# Patient Record
Sex: Female | Born: 1985 | Race: White | Hispanic: No | Marital: Married | Smoking: Never smoker
Health system: Southern US, Community
[De-identification: ages and names within clinical notes are randomized; demographics above are authoritative.]

## PROBLEM LIST (undated history)

## (undated) DIAGNOSIS — Z789 Other specified health status: Secondary | ICD-10-CM

## (undated) DIAGNOSIS — Z8619 Personal history of other infectious and parasitic diseases: Secondary | ICD-10-CM

## (undated) HISTORY — PX: NO PAST SURGERIES: SHX2092

## (undated) HISTORY — DX: Personal history of other infectious and parasitic diseases: Z86.19

---

## 2008-03-03 ENCOUNTER — Emergency Department (HOSPITAL_COMMUNITY): Admission: EM | Admit: 2008-03-03 | Discharge: 2008-03-04 | Payer: Self-pay | Admitting: Emergency Medicine

## 2008-03-04 ENCOUNTER — Emergency Department (HOSPITAL_COMMUNITY): Admission: EM | Admit: 2008-03-04 | Discharge: 2008-03-04 | Payer: Self-pay | Admitting: Family Medicine

## 2008-07-13 ENCOUNTER — Emergency Department (HOSPITAL_COMMUNITY): Admission: EM | Admit: 2008-07-13 | Discharge: 2008-07-13 | Payer: Self-pay | Admitting: Emergency Medicine

## 2008-10-20 ENCOUNTER — Inpatient Hospital Stay (HOSPITAL_COMMUNITY): Admission: AD | Admit: 2008-10-20 | Discharge: 2008-10-20 | Payer: Self-pay | Admitting: Obstetrics and Gynecology

## 2008-10-20 ENCOUNTER — Ambulatory Visit: Payer: Self-pay | Admitting: Obstetrics and Gynecology

## 2008-10-23 ENCOUNTER — Inpatient Hospital Stay (HOSPITAL_COMMUNITY): Admission: AD | Admit: 2008-10-23 | Discharge: 2008-10-23 | Payer: Self-pay | Admitting: Obstetrics and Gynecology

## 2008-12-05 ENCOUNTER — Inpatient Hospital Stay (HOSPITAL_COMMUNITY): Admission: AD | Admit: 2008-12-05 | Discharge: 2008-12-05 | Payer: Self-pay | Admitting: Obstetrics and Gynecology

## 2008-12-27 ENCOUNTER — Inpatient Hospital Stay (HOSPITAL_COMMUNITY): Admission: AD | Admit: 2008-12-27 | Discharge: 2008-12-29 | Payer: Self-pay | Admitting: Obstetrics and Gynecology

## 2009-08-05 HISTORY — PX: EYE SURGERY: SHX253

## 2009-10-31 ENCOUNTER — Encounter: Admission: RE | Admit: 2009-10-31 | Discharge: 2009-11-27 | Payer: Self-pay | Admitting: Family Medicine

## 2010-04-12 ENCOUNTER — Other Ambulatory Visit: Admission: RE | Admit: 2010-04-12 | Discharge: 2010-04-12 | Payer: Self-pay | Admitting: Family Medicine

## 2010-05-24 ENCOUNTER — Encounter: Admission: RE | Admit: 2010-05-24 | Discharge: 2010-05-24 | Payer: Self-pay | Admitting: Family Medicine

## 2010-05-28 ENCOUNTER — Emergency Department (HOSPITAL_COMMUNITY): Admission: EM | Admit: 2010-05-28 | Discharge: 2010-05-28 | Payer: Self-pay | Admitting: Emergency Medicine

## 2010-10-17 LAB — URINALYSIS, ROUTINE W REFLEX MICROSCOPIC
Glucose, UA: NEGATIVE mg/dL
Protein, ur: NEGATIVE mg/dL
pH: 5.5 (ref 5.0–8.0)

## 2010-10-17 LAB — BASIC METABOLIC PANEL
BUN: 14 mg/dL (ref 6–23)
Calcium: 9.5 mg/dL (ref 8.4–10.5)
Creatinine, Ser: 0.65 mg/dL (ref 0.4–1.2)
GFR calc non Af Amer: >60 mL/min (ref >60)
Potassium: 4.1 mEq/L (ref 3.5–5.1)

## 2010-10-17 LAB — CBC
HCT: 45.6 % (ref 36.0–46.0)
Platelets: 376 10*3/uL (ref 150–400)
RDW: 12.9 % (ref 11.5–15.5)
WBC: 16.7 10*3/uL — ABNORMAL HIGH (ref 4.0–10.5)

## 2010-10-17 LAB — URINE MICROSCOPIC-ADD ON

## 2010-11-13 LAB — CBC
HCT: 34.3 % — ABNORMAL LOW (ref 36.0–46.0)
Hemoglobin: 11.9 g/dL — ABNORMAL LOW (ref 12.0–15.0)
Hemoglobin: 13.9 g/dL (ref 12.0–15.0)
RBC: 3.65 MIL/uL — ABNORMAL LOW (ref 3.87–5.11)
RBC: 4.24 MIL/uL (ref 3.87–5.11)
WBC: 12.9 10*3/uL — ABNORMAL HIGH (ref 4.0–10.5)
WBC: 13.9 10*3/uL — ABNORMAL HIGH (ref 4.0–10.5)

## 2010-11-13 LAB — URINALYSIS, ROUTINE W REFLEX MICROSCOPIC
Bilirubin Urine: NEGATIVE
Hgb urine dipstick: NEGATIVE
Specific Gravity, Urine: <1.005 — ABNORMAL LOW (ref 1.005–1.030)
pH: 6 (ref 5.0–8.0)

## 2010-11-13 LAB — WET PREP, GENITAL
Clue Cells Wet Prep HPF POC: NONE SEEN
Trich, Wet Prep: NONE SEEN
Yeast Wet Prep HPF POC: NONE SEEN

## 2010-11-13 LAB — CCBB MATERNAL DONOR DRAW

## 2010-11-13 LAB — RPR: RPR Ser Ql: NONREACTIVE

## 2010-11-15 LAB — CBC
HCT: 38 % (ref 36.0–46.0)
Hemoglobin: 13.1 g/dL (ref 12.0–15.0)
MCV: 92.6 fL (ref 78.0–100.0)
Platelets: 242 10*3/uL (ref 150–400)
WBC: 16.4 10*3/uL — ABNORMAL HIGH (ref 4.0–10.5)

## 2010-11-15 LAB — DIFFERENTIAL
Eosinophils Absolute: 0.2 10*3/uL (ref 0.0–0.7)
Eosinophils Relative: 1 % (ref 0–5)
Lymphocytes Relative: 19 % (ref 12–46)
Lymphs Abs: 3.2 10*3/uL (ref 0.7–4.0)
Monocytes Absolute: 1 10*3/uL (ref 0.1–1.0)
Monocytes Relative: 6 % (ref 3–12)

## 2010-11-15 LAB — URINALYSIS, ROUTINE W REFLEX MICROSCOPIC
Glucose, UA: NEGATIVE mg/dL
Ketones, ur: NEGATIVE mg/dL
Protein, ur: NEGATIVE mg/dL
Urobilinogen, UA: 0.2 mg/dL (ref 0.0–1.0)

## 2010-11-15 LAB — URINE MICROSCOPIC-ADD ON

## 2010-12-18 NOTE — Discharge Summary (Signed)
Jenna [name], Jenna [name]             ACCOUNT NO.:  [id number]   MEDICAL RECORD NO.:  [id number]          PATIENT TYPE:  INP   LOCATION:  9114                          FACILITY:  [facility]   PHYSICIAN:  Sherron Monday, MD        DATE OF BIRTH:  [birth year]   DATE OF ADMISSION:  12/27/2008  DATE OF DISCHARGE:  12/29/2008                               DISCHARGE SUMMARY   ADMITTING DIAGNOSIS:  Intrauterine pregnancy at term with spontaneous  rupture of membranes.   DISCHARGE DIAGNOSIS:  Intrauterine pregnancy at term with spontaneous  rupture of membranes, delivered via spontaneous vaginal delivery.   HISTORY OF PRESENT ILLNESS:  A 25 year old, G1, P0, at 33 plus weeks  with an [facility] of Dec 28, 2008, by a 8-week ultrasound, presented with  spontaneous rupture of membranes at 3:30 a.m. for clear fluid.  She  states she has had regular contractions for 24 hours.  On admission, she  was 2 cm dilated and grossly ruptured.  Prenatal care was uncomplicated  except for a positive group B strep.  She is a smoker who decreased her  smoking with her pregnancy.   Past medical history is not significant.   Past surgical history significant for wisdom teeth extraction, Lasix,  and myringotomy.   PAST OBSTETRIC/GYNECOLOGIC HISTORY:  G1 is the current pregnancy.  She  has a history of Chlamydia, however, was negative with the pregnancy.  No history of any abnormal Pap smears.   Medications include prenatal vitamins and Tylenol.   ALLERGIES:  No known drug allergies.   SOCIAL HISTORY:  Denies alcohol, tobacco, or drug use.   FAMILY HISTORY:  Per the prenatal forms.   PRENATAL LABORATORY DATA:  O positive, antibody screen negative, rubella  immune, hepatitis B surface antigen negative, HIV negative, RPR  nonreactive.  Gonorrhea and chlamydia negative.  Glucola 71.  First and  second trimester screen were declined.  Group B strep was positive.   She was admitted and had an IUPC placed.  She was complete +3  and pushed  for approximately 20 minutes and delivered a viable female infant at  10:06 a.m. with Apgars of 9 at 1 minute and 9 at 5 minutes and a weight  of 6 pounds 3 ounces.  Placenta was expressed intact, and a left labial  laceration was repaired with 3-0 Vicryl Rapide in the typical fashion.  EBL of 500 mL.  Her postpartum course was relatively uncomplicated.  She  remained afebrile with vital signs stable throughout.  Hemoglobin  decreased from 13.9 to 11.9 on day of discharge.  She states she had no  complaints.  She had normal lochia.  Pain was well controlled.  She is  ambulating without difficulty or problems.  She was discharged home with  routine discharge instructions including numbers to call with any  questions or problems as well as prescriptions for Motrin, Vicodin,  prenatal vitamins.   DISCHARGE INFORMATION:  She is O positive, rubella immune.  She plans to  bottle feed, will use oral contraceptive pills, and she will begin a 6-  week checkup.  Her  hemoglobin decreased from 13.9 to 11.9.      Sherron Monday, MD  Electronically Signed     JB/MEDQ  D:  12/29/2008  T:  12/29/2008  Job:  [id number]

## 2011-02-07 IMAGING — CT CT HEAD W/O CM
1 of 2 series · 13 of 30 positions shown, 17 images · non-contrast
Comparison: None.

CLINICAL DATA: Left sided facial and lip numbness.

CT HEAD WITHOUT CONTRAST
TECHNIQUE: Contiguous axial images were obtained from the base of
the skull through the vertex without contrast.

[Series 2: brain · axial · 0.49mm/px · z∈[+122,+254]mm · 13 of 32 slices shown, 17 images]
[im 3/32  brain]
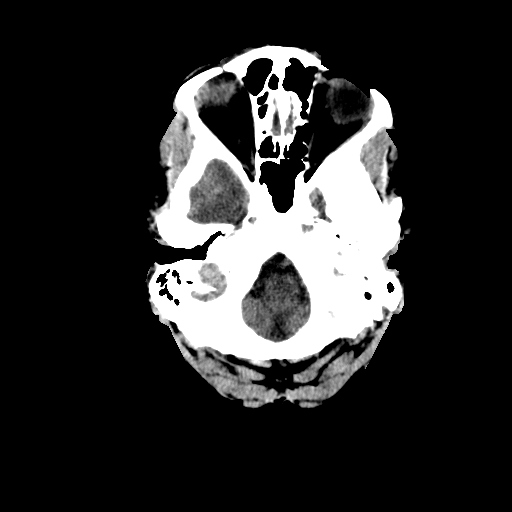
[im 3/32  bone]
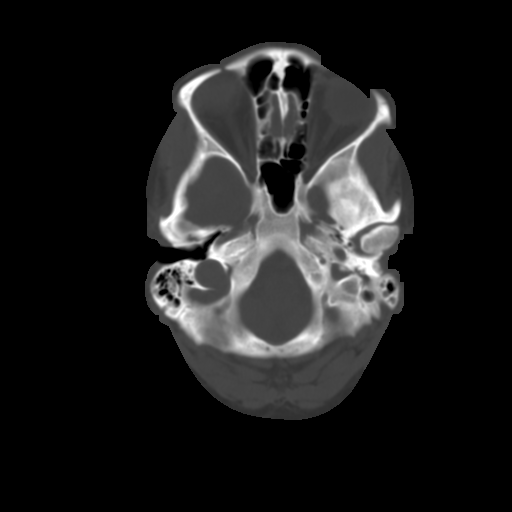
[im 5/32  brain]
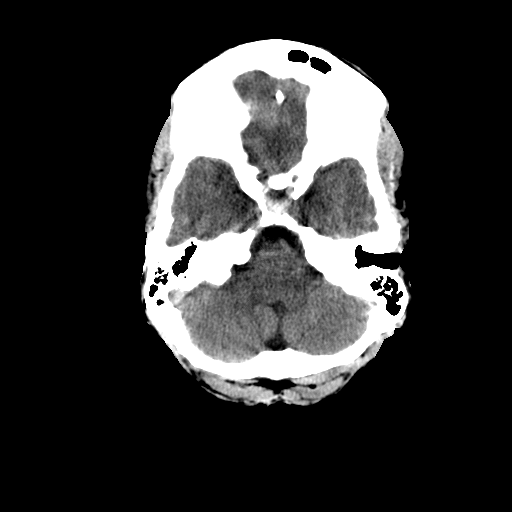
[im 7/32  brain]
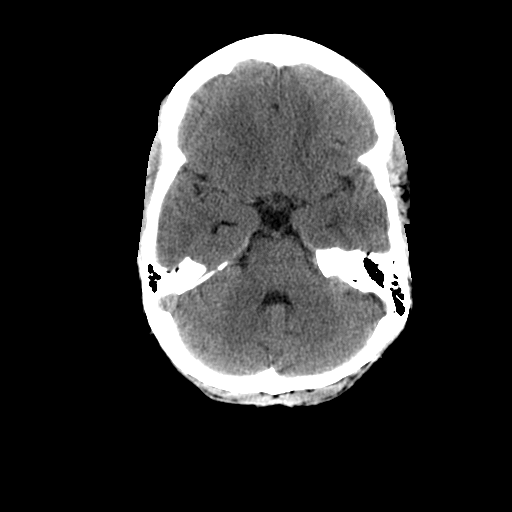
[im 9/32  brain]
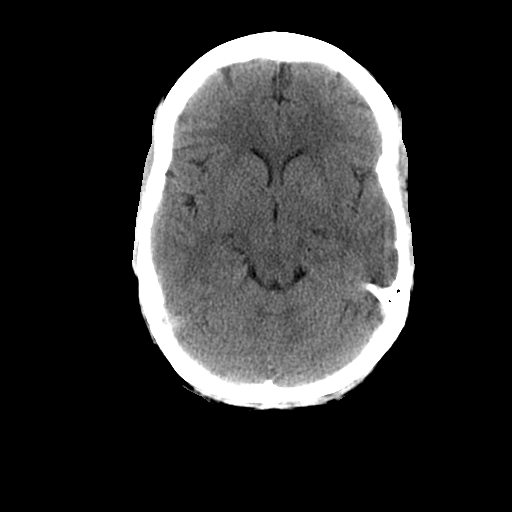
[im 12/32  brain]
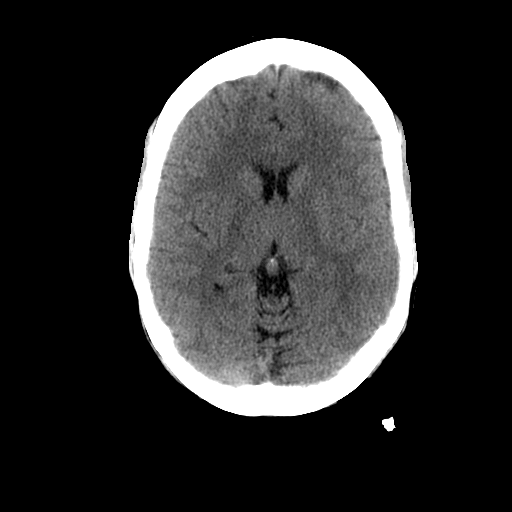
[im 12/32  bone]
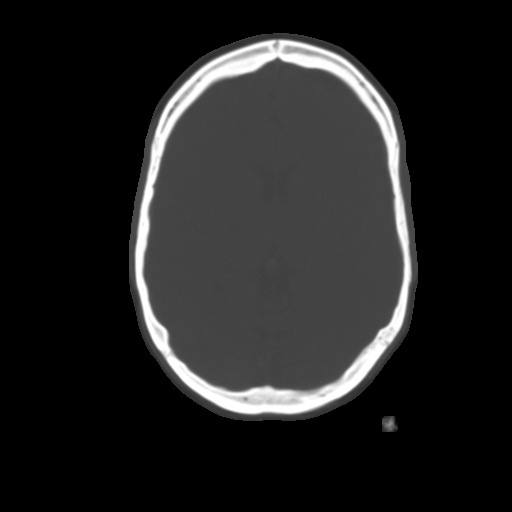
[im 14/32  brain]
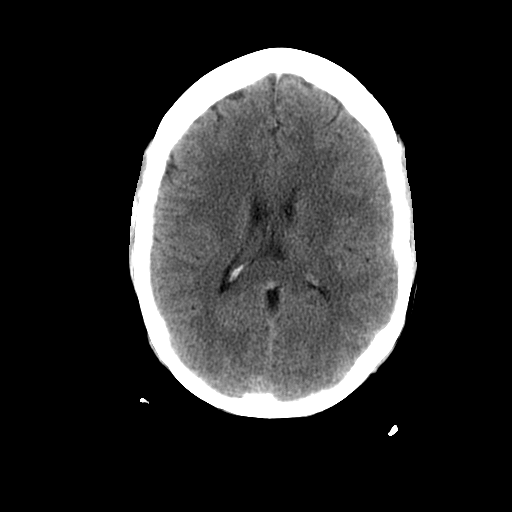
[im 16/32  brain]
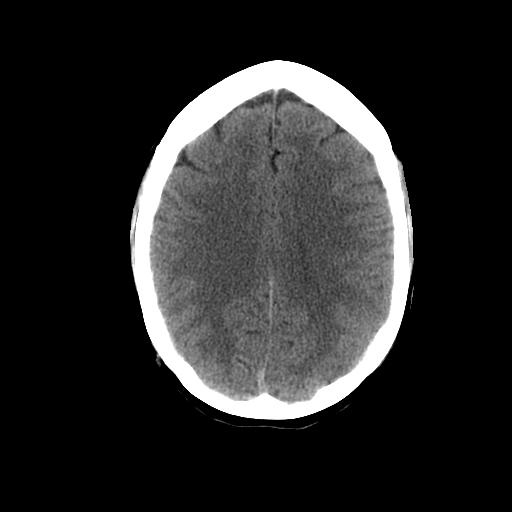
[im 18/32  brain]
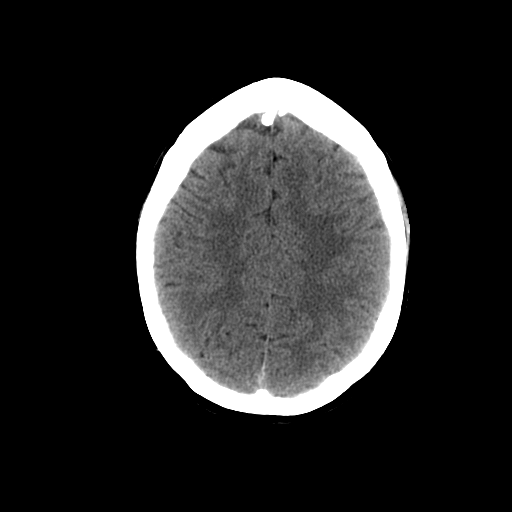
[im 20/32  brain]
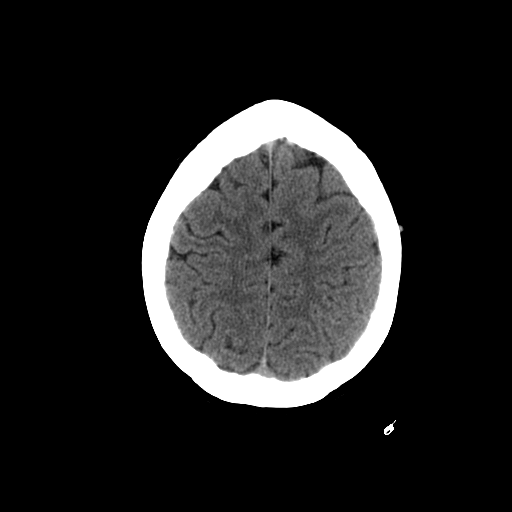
[im 20/32  bone]
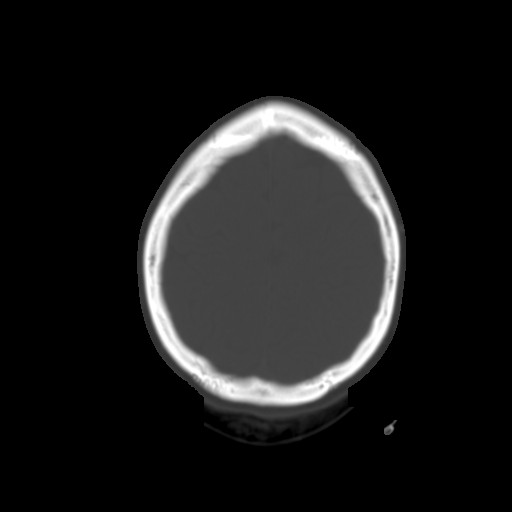
[im 23/32  brain]
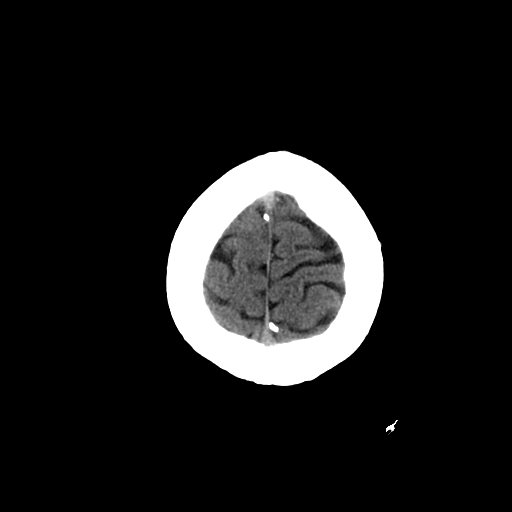
[im 25/32  brain]
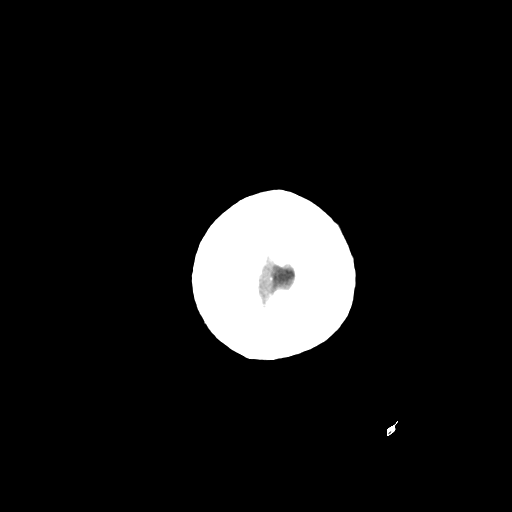
[im 27/32  brain]
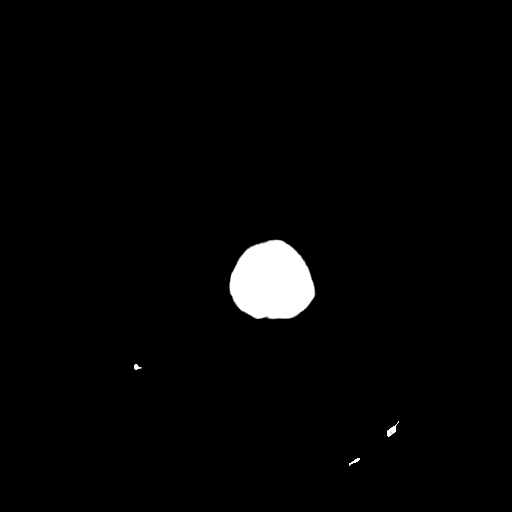
[im 29/32  brain]
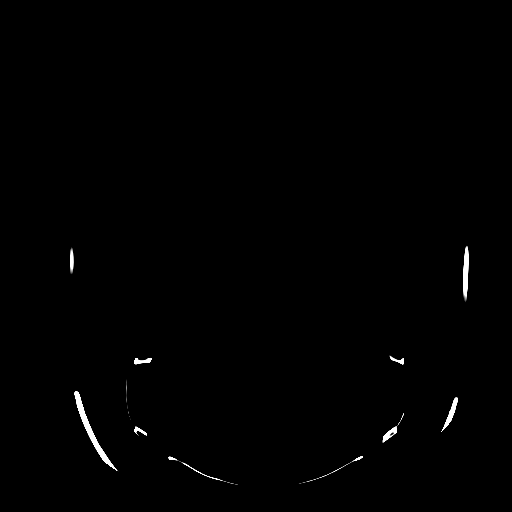
[im 29/32  bone]
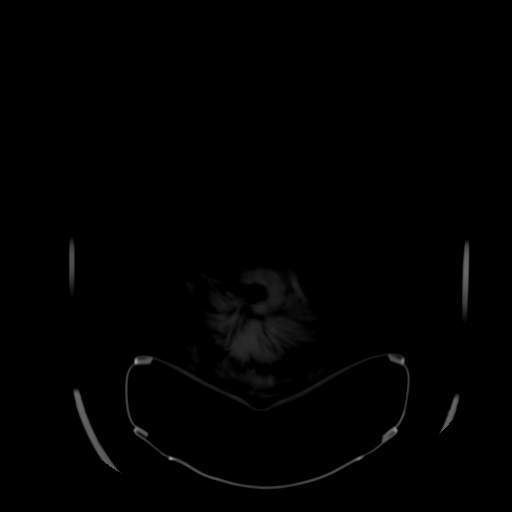

[13 of 30 positions shown; findings below may reference images not displayed]

FINDINGS: The brain appears normal without evidence of acute
infarction, hemorrhage, mass lesion, mass effect, midline shift or
abnormal extra-axial fluid collection.  No hydrocephalus.
Calvarium intact.
IMPRESSION: Normal exam.

## 2011-05-10 LAB — POCT URINALYSIS DIP (DEVICE)
Bilirubin Urine: NEGATIVE
Glucose, UA: NEGATIVE mg/dL
Ketones, ur: NEGATIVE mg/dL
Protein, ur: NEGATIVE mg/dL
Specific Gravity, Urine: 1.02 (ref 1.005–1.030)

## 2011-05-10 LAB — URINE CULTURE: Culture: NO GROWTH

## 2011-05-28 ENCOUNTER — Other Ambulatory Visit (HOSPITAL_COMMUNITY)
Admission: RE | Admit: 2011-05-28 | Discharge: 2011-05-28 | Disposition: A | Discharge status: Home / Self Care | Payer: PRIVATE HEALTH INSURANCE | Source: Ambulatory Visit | Attending: Family Medicine | Admitting: Family Medicine

## 2011-05-28 ENCOUNTER — Other Ambulatory Visit: Payer: Self-pay | Admitting: Family Medicine

## 2011-05-28 DIAGNOSIS — N63 Unspecified lump in unspecified breast: Secondary | ICD-10-CM

## 2011-05-28 DIAGNOSIS — Z124 Encounter for screening for malignant neoplasm of cervix: Secondary | ICD-10-CM | POA: Insufficient documentation

## 2011-06-04 ENCOUNTER — Other Ambulatory Visit: Payer: Self-pay

## 2011-06-19 ENCOUNTER — Ambulatory Visit
Admission: RE | Admit: 2011-06-19 | Discharge: 2011-06-19 | Disposition: A | Discharge status: Home / Self Care | Payer: PRIVATE HEALTH INSURANCE | Source: Ambulatory Visit | Attending: Family Medicine | Admitting: Family Medicine

## 2011-06-19 DIAGNOSIS — N63 Unspecified lump in unspecified breast: Secondary | ICD-10-CM

## 2015-06-15 LAB — OB RESULTS CONSOLE ABO/RH: RH Type: POSITIVE

## 2015-06-15 LAB — OB RESULTS CONSOLE HEPATITIS B SURFACE ANTIGEN: Hepatitis B Surface Ag: NEGATIVE

## 2015-06-15 LAB — OB RESULTS CONSOLE HIV ANTIBODY (ROUTINE TESTING): HIV: NONREACTIVE

## 2015-06-15 LAB — OB RESULTS CONSOLE GC/CHLAMYDIA
CHLAMYDIA, DNA PROBE: NEGATIVE
Gonorrhea: NEGATIVE

## 2015-06-15 LAB — OB RESULTS CONSOLE ANTIBODY SCREEN: Antibody Screen: NEGATIVE

## 2015-06-15 LAB — OB RESULTS CONSOLE RPR: RPR: NONREACTIVE

## 2015-06-15 LAB — OB RESULTS CONSOLE RUBELLA ANTIBODY, IGM: Rubella: IMMUNE

## 2015-08-06 NOTE — L&D Delivery Note (Signed)
Delivery Note At 8:50 PM a viable female was delivered via Vaginal, Spontaneous Delivery (Presentation: vtx; Occiput Anterior).  APGAR: 9, 9; weight pending.   Placenta status: Intact, Spontaneous.  Cord:  with the following complications: None.   Anesthesia: Epidural  Episiotomy: None Lacerations: Periurethral Suture Repair: none Est. Blood Loss (mL): 200  Mom to postpartum.  Baby to Couplet care / Skin to Skin.  Desires BTL, discussed permanency, procedure and risks, will schedule for the am.  Also discussed circumcision procedure and risks, will schedule in am.   Florene Brill D 01/06/2016, 9:09 PM

## 2015-09-27 ENCOUNTER — Inpatient Hospital Stay (HOSPITAL_COMMUNITY)
Admission: AD | Admit: 2015-09-27 | Payer: PRIVATE HEALTH INSURANCE | Source: Ambulatory Visit | Admitting: Obstetrics and Gynecology

## 2015-12-15 LAB — OB RESULTS CONSOLE GBS: STREP GROUP B AG: POSITIVE

## 2016-01-03 ENCOUNTER — Telehealth (HOSPITAL_COMMUNITY): Payer: Self-pay | Admitting: *Deleted

## 2016-01-03 ENCOUNTER — Encounter (HOSPITAL_COMMUNITY): Payer: Self-pay | Admitting: *Deleted

## 2016-01-03 NOTE — Telephone Encounter (Signed)
Preadmission screen  

## 2016-01-06 ENCOUNTER — Inpatient Hospital Stay (HOSPITAL_COMMUNITY): Payer: BLUE CROSS/BLUE SHIELD | Admitting: Anesthesiology

## 2016-01-06 ENCOUNTER — Encounter (HOSPITAL_COMMUNITY): Payer: Self-pay

## 2016-01-06 ENCOUNTER — Inpatient Hospital Stay (HOSPITAL_COMMUNITY)
Admission: AD | Admit: 2016-01-06 | Discharge: 2016-01-08 | DRG: 767 | Disposition: A | Discharge status: Home / Self Care | Payer: BLUE CROSS/BLUE SHIELD | Source: Ambulatory Visit | Attending: Obstetrics and Gynecology | Admitting: Obstetrics and Gynecology

## 2016-01-06 DIAGNOSIS — Z3480 Encounter for supervision of other normal pregnancy, unspecified trimester: Secondary | ICD-10-CM

## 2016-01-06 DIAGNOSIS — Z3A39 39 weeks gestation of pregnancy: Secondary | ICD-10-CM

## 2016-01-06 DIAGNOSIS — Z833 Family history of diabetes mellitus: Secondary | ICD-10-CM

## 2016-01-06 DIAGNOSIS — O99824 Streptococcus B carrier state complicating childbirth: Secondary | ICD-10-CM | POA: Diagnosis present

## 2016-01-06 DIAGNOSIS — Z8249 Family history of ischemic heart disease and other diseases of the circulatory system: Secondary | ICD-10-CM

## 2016-01-06 DIAGNOSIS — Z302 Encounter for sterilization: Secondary | ICD-10-CM | POA: Diagnosis not present

## 2016-01-06 HISTORY — DX: Other specified health status: Z78.9

## 2016-01-06 LAB — CBC
HEMATOCRIT: 37.1 % (ref 36.0–46.0)
Hemoglobin: 13.1 g/dL (ref 12.0–15.0)
MCH: 31 pg (ref 26.0–34.0)
MCHC: 35.3 g/dL (ref 30.0–36.0)
MCV: 87.9 fL (ref 78.0–100.0)
PLATELETS: 244 10*3/uL (ref 150–400)
RBC: 4.22 MIL/uL (ref 3.87–5.11)
RDW: 13.7 % (ref 11.5–15.5)
WBC: 10.9 10*3/uL — AB (ref 4.0–10.5)

## 2016-01-06 LAB — POCT FERN TEST
POCT FERN TEST: POSITIVE
POCT Fern Test: NEGATIVE

## 2016-01-06 LAB — TYPE AND SCREEN
ABO/RH(D): O POS
Antibody Screen: NEGATIVE

## 2016-01-06 LAB — ABO/RH: ABO/RH(D): O POS

## 2016-01-06 MED ORDER — ACETAMINOPHEN 325 MG PO TABS
650.0000 mg | ORAL_TABLET | ORAL | Status: DC | PRN
  Administered 2016-01-07 – 2016-01-08 (×2): 650 mg via ORAL
  Filled 2016-01-06 (×2): qty 2

## 2016-01-06 MED ORDER — LORATADINE 10 MG PO TABS
10.0000 mg | ORAL_TABLET | Freq: Every day | ORAL | Status: DC
  Filled 2016-01-06 (×2): qty 1

## 2016-01-06 MED ORDER — SIMETHICONE 80 MG PO CHEW: 80.0000 mg | CHEWABLE_TABLET | ORAL | Status: DC | PRN

## 2016-01-06 MED ORDER — LACTATED RINGERS IV SOLN: 500.0000 mL | Freq: Once | INTRAVENOUS | Status: DC

## 2016-01-06 MED ORDER — PENICILLIN G POTASSIUM 5000000 UNITS IJ SOLR
5.0000 10*6.[IU] | Freq: Once | INTRAVENOUS | Status: AC
  Administered 2016-01-06: 5 10*6.[IU] via INTRAVENOUS
  Filled 2016-01-06: qty 5

## 2016-01-06 MED ORDER — LIDOCAINE HCL (PF) 1 % IJ SOLN
INTRAMUSCULAR | Status: DC | PRN
  Administered 2016-01-06 (×2): 5 mL

## 2016-01-06 MED ORDER — MEASLES, MUMPS & RUBELLA VAC ~~LOC~~ INJ
0.5000 mL | INJECTION | Freq: Once | SUBCUTANEOUS | Status: DC
  Filled 2016-01-06: qty 0.5

## 2016-01-06 MED ORDER — EPHEDRINE 5 MG/ML INJ
10.0000 mg | INTRAVENOUS | Status: DC | PRN
  Filled 2016-01-06: qty 2

## 2016-01-06 MED ORDER — LACTATED RINGERS IV SOLN
500.0000 mL | INTRAVENOUS | Status: DC | PRN
  Administered 2016-01-06: 500 mL via INTRAVENOUS

## 2016-01-06 MED ORDER — SENNOSIDES-DOCUSATE SODIUM 8.6-50 MG PO TABS
2.0000 | ORAL_TABLET | ORAL | Status: DC
  Administered 2016-01-06: 2 via ORAL
  Filled 2016-01-06: qty 2

## 2016-01-06 MED ORDER — PRENATAL MULTIVITAMIN CH: 1.0000 | ORAL_TABLET | Freq: Every day | ORAL | Status: DC

## 2016-01-06 MED ORDER — IBUPROFEN 600 MG PO TABS
600.0000 mg | ORAL_TABLET | Freq: Four times a day (QID) | ORAL | Status: DC
  Administered 2016-01-06 – 2016-01-08 (×5): 600 mg via ORAL
  Filled 2016-01-06 (×6): qty 1

## 2016-01-06 MED ORDER — FENTANYL 2.5 MCG/ML BUPIVACAINE 1/10 % EPIDURAL INFUSION (WH - ANES)
14.0000 mL/h | INTRAMUSCULAR | Status: DC | PRN
  Administered 2016-01-06 (×2): 14 mL/h via EPIDURAL
  Filled 2016-01-06 (×2): qty 125

## 2016-01-06 MED ORDER — METHYLERGONOVINE MALEATE 0.2 MG PO TABS: 0.2000 mg | ORAL_TABLET | ORAL | Status: DC | PRN

## 2016-01-06 MED ORDER — OXYTOCIN BOLUS FROM INFUSION: 500.0000 mL | INTRAVENOUS | Status: DC

## 2016-01-06 MED ORDER — ONDANSETRON HCL 4 MG PO TABS: 4.0000 mg | ORAL_TABLET | ORAL | Status: DC | PRN

## 2016-01-06 MED ORDER — OXYTOCIN 40 UNITS IN LACTATED RINGERS INFUSION - SIMPLE MED: 2.5000 [IU]/h | INTRAVENOUS | Status: DC

## 2016-01-06 MED ORDER — PHENYLEPHRINE 40 MCG/ML (10ML) SYRINGE FOR IV PUSH (FOR BLOOD PRESSURE SUPPORT)
80.0000 ug | PREFILLED_SYRINGE | INTRAVENOUS | Status: DC | PRN
  Filled 2016-01-06: qty 5
  Filled 2016-01-06: qty 10

## 2016-01-06 MED ORDER — DIPHENHYDRAMINE HCL 50 MG/ML IJ SOLN: 12.5000 mg | INTRAMUSCULAR | Status: DC | PRN

## 2016-01-06 MED ORDER — FLUTICASONE PROPIONATE 50 MCG/ACT NA SUSP
1.0000 | Freq: Every day | NASAL | Status: DC
  Filled 2016-01-06: qty 16

## 2016-01-06 MED ORDER — TERBUTALINE SULFATE 1 MG/ML IJ SOLN
0.2500 mg | Freq: Once | INTRAMUSCULAR | Status: DC | PRN
  Filled 2016-01-06: qty 1

## 2016-01-06 MED ORDER — TETANUS-DIPHTH-ACELL PERTUSSIS 5-2.5-18.5 LF-MCG/0.5 IM SUSP
0.5000 mL | Freq: Once | INTRAMUSCULAR | Status: DC
Start: 2016-01-07 — End: 2016-01-08

## 2016-01-06 MED ORDER — OXYTOCIN 40 UNITS IN LACTATED RINGERS INFUSION - SIMPLE MED
1.0000 m[IU]/min | INTRAVENOUS | Status: DC
  Administered 2016-01-06: 1 m[IU]/min via INTRAVENOUS
  Filled 2016-01-06: qty 1000

## 2016-01-06 MED ORDER — METHYLERGONOVINE MALEATE 0.2 MG/ML IJ SOLN: 0.2000 mg | INTRAMUSCULAR | Status: DC | PRN

## 2016-01-06 MED ORDER — LIDOCAINE HCL (PF) 1 % IJ SOLN
30.0000 mL | INTRAMUSCULAR | Status: DC | PRN
  Filled 2016-01-06: qty 30

## 2016-01-06 MED ORDER — DIBUCAINE 1 % RE OINT
1.0000 | TOPICAL_OINTMENT | RECTAL | Status: DC | PRN
Start: 2016-01-06 — End: 2016-01-08

## 2016-01-06 MED ORDER — PENICILLIN G POTASSIUM 5000000 UNITS IJ SOLR
2.5000 10*6.[IU] | INTRAVENOUS | Status: DC
  Administered 2016-01-06 (×2): 2.5 10*6.[IU] via INTRAVENOUS
  Filled 2016-01-06 (×3): qty 2.5

## 2016-01-06 MED ORDER — DIPHENHYDRAMINE HCL 25 MG PO CAPS: 25.0000 mg | ORAL_CAPSULE | Freq: Four times a day (QID) | ORAL | Status: DC | PRN

## 2016-01-06 MED ORDER — LACTATED RINGERS IV SOLN
INTRAVENOUS | Status: DC
  Administered 2016-01-06: 11:00:00 via INTRAVENOUS

## 2016-01-06 MED ORDER — OXYCODONE HCL 5 MG PO TABS
5.0000 mg | ORAL_TABLET | ORAL | Status: DC | PRN
  Administered 2016-01-07 – 2016-01-08 (×5): 5 mg via ORAL
  Filled 2016-01-06 (×5): qty 1

## 2016-01-06 MED ORDER — WITCH HAZEL-GLYCERIN EX PADS: 1.0000 "application " | MEDICATED_PAD | CUTANEOUS | Status: DC | PRN

## 2016-01-06 MED ORDER — OXYCODONE-ACETAMINOPHEN 5-325 MG PO TABS: 2.0000 | ORAL_TABLET | ORAL | Status: DC | PRN

## 2016-01-06 MED ORDER — EPHEDRINE 5 MG/ML INJ
10.0000 mg | INTRAVENOUS | Status: DC | PRN
Start: 2016-01-06 — End: 2016-01-06
  Filled 2016-01-06: qty 2

## 2016-01-06 MED ORDER — ZOLPIDEM TARTRATE 5 MG PO TABS: 5.0000 mg | ORAL_TABLET | Freq: Every evening | ORAL | Status: DC | PRN

## 2016-01-06 MED ORDER — MAGNESIUM HYDROXIDE 400 MG/5ML PO SUSP: 30.0000 mL | ORAL | Status: DC | PRN

## 2016-01-06 MED ORDER — COCONUT OIL OIL: 1.0000 "application " | TOPICAL_OIL | Status: DC | PRN

## 2016-01-06 MED ORDER — BUTORPHANOL TARTRATE 1 MG/ML IJ SOLN: 1.0000 mg | INTRAMUSCULAR | Status: DC | PRN

## 2016-01-06 MED ORDER — OXYCODONE-ACETAMINOPHEN 5-325 MG PO TABS
1.0000 | ORAL_TABLET | ORAL | Status: DC | PRN
Start: 2016-01-06 — End: 2016-01-06

## 2016-01-06 MED ORDER — SOD CITRATE-CITRIC ACID 500-334 MG/5ML PO SOLN: 30.0000 mL | ORAL | Status: DC | PRN

## 2016-01-06 MED ORDER — ONDANSETRON HCL 4 MG/2ML IJ SOLN: 4.0000 mg | INTRAMUSCULAR | Status: DC | PRN

## 2016-01-06 MED ORDER — BENZOCAINE-MENTHOL 20-0.5 % EX AERO
1.0000 | INHALATION_SPRAY | CUTANEOUS | Status: DC | PRN
Start: 2016-01-06 — End: 2016-01-08
  Administered 2016-01-07: 1 via TOPICAL
  Filled 2016-01-06: qty 56

## 2016-01-06 MED ORDER — PHENYLEPHRINE 40 MCG/ML (10ML) SYRINGE FOR IV PUSH (FOR BLOOD PRESSURE SUPPORT)
80.0000 ug | PREFILLED_SYRINGE | INTRAVENOUS | Status: DC | PRN
Start: 2016-01-06 — End: 2016-01-06
  Administered 2016-01-06: 80 ug via INTRAVENOUS
  Filled 2016-01-06: qty 5

## 2016-01-06 MED ORDER — OXYCODONE HCL 5 MG PO TABS
10.0000 mg | ORAL_TABLET | ORAL | Status: DC | PRN
  Filled 2016-01-06: qty 2

## 2016-01-06 MED ORDER — ONDANSETRON HCL 4 MG/2ML IJ SOLN
4.0000 mg | Freq: Four times a day (QID) | INTRAMUSCULAR | Status: DC | PRN
  Administered 2016-01-06: 4 mg via INTRAVENOUS
  Filled 2016-01-06: qty 2

## 2016-01-06 NOTE — Lactation Note (Signed)
This note was copied from a baby's chart. Lactation Consultation Note  Patient Name: Jenna [name] VHQIO'NToday's Date: 01/06/2016 Reason for consult: Initial assessment Baby at 1 hr of life. Mom tried latching baby to the L breast but had a burning pain throughout the entire feeding. Moved baby to football position, showed mom how to "tea cup" the areola so that baby could get a deeper latch. Mom has large breast that fall to the side with the nipple pointing down. Mom did not bf her older child and is afraid of cracked bleeding nipples. She was very tired and hungry at this visit. She may need more bf education. Demonstrated manual expression, colostrum noted bilaterally. Given lactation handouts. Aware of OP services and support group.     Maternal Data Has patient been taught Hand Expression?: Yes Does the patient have breastfeeding experience prior to this delivery?: No  Feeding Feeding Type: Breast Fed  LATCH Score/Interventions Latch: Repeated attempts needed to sustain latch, nipple held in mouth throughout feeding, stimulation needed to elicit sucking reflex. Intervention(s): Adjust position;Breast compression  Audible Swallowing: A few with stimulation Intervention(s): Hand expression;Alternate breast massage  Type of Nipple: Everted at rest and after stimulation  Comfort (Breast/Nipple): Soft / non-tender     Hold (Positioning): Assistance needed to correctly position infant at breast and maintain latch. Intervention(s): Support Pillows;Position options  LATCH Score: 7  Lactation Tools Discussed/Used WIC Program: No   Consult Status Consult Status: Follow-up Date: 01/07/16 Follow-up type: In-patient    Jenna [name] 01/06/2016, 10:24 PM

## 2016-01-06 NOTE — Anesthesia Procedure Notes (Signed)
Epidural Patient location during procedure: OB  Staffing Anesthesiologist: Tyrus Wilms Performed by: anesthesiologist   Preanesthetic Checklist Completed: patient identified, site marked, surgical consent, pre-op evaluation, timeout performed, IV checked, risks and benefits discussed and monitors and equipment checked  Epidural Patient position: sitting Prep: DuraPrep Patient monitoring: heart rate, continuous pulse ox and blood pressure Approach: right paramedian Location: L3-L4 Injection technique: LOR saline  Needle:  Needle type: Tuohy  Needle gauge: 17 G Needle length: 9 cm and 9 Needle insertion depth: 7 cm Catheter type: closed end flexible Catheter size: 20 Guage Catheter at skin depth: 11 cm Test dose: negative  Assessment Events: blood not aspirated, injection not painful, no injection resistance, negative IV test and no paresthesia  Additional Notes Patient identified. Risks/Benefits/Options discussed with patient including but not limited to bleeding, infection, nerve damage, paralysis, failed block, incomplete pain control, headache, blood pressure changes, nausea, vomiting, reactions to medication both or allergic, itching and postpartum back pain. Confirmed with bedside nurse the patient's most recent platelet count. Confirmed with patient that they are not currently taking any anticoagulation, have any bleeding history or any family history of bleeding disorders. Patient expressed understanding and wished to proceed. All questions were answered. Sterile technique was used throughout the entire procedure. Please see nursing notes for vital signs. Test dose was given through epidural needle and negative prior to continuing to dose epidural or start infusion. Warning signs of high block given to the patient including shortness of breath, tingling/numbness in hands, complete motor block, or any concerning symptoms with instructions to call for help. Patient was given  instructions on fall risk and not to get out of bed. All questions and concerns addressed with instructions to call with any issues.   

## 2016-01-06 NOTE — H&P (Signed)
Jenna [name] is a 30 y.o. female, G2 P1001, EGA 39+ weeks with EDC 6-7 presenting for leaking fluid and ctx.  Eval in MAU confirmed ROM, VE 2-3 cm, fairly regular ctx.  Pt was admitted and received an epidural, ctx spaced out and she was placed on pitocin for augmentation, eventually entered active labor and progressed quickly.  Prenatal essentially uncomplicated, desires BTL.    Maternal Medical History:  Reason for admission: Rupture of membranes and contractions.   Contractions: Frequency: irregular.   Perceived severity is mild.    Fetal activity: Perceived fetal activity is normal.    Prenatal complications: no prenatal complications   OB History    Gravida Para Term Preterm AB TAB SAB Ectopic Multiple Living   2 1 1       1      Past Medical History  Diagnosis Date  . Hx of varicella   . Medical history non-contributory    Past Surgical History  Procedure Laterality Date  . No past surgeries    . Eye surgery  2011    Lasix eye surgery   Family History: family history includes Diabetes in her maternal grandmother, mother, paternal aunt, and paternal grandfather; Heart disease in her father and maternal grandfather. Social History:  reports that she has never smoked. She has never used smokeless tobacco. She reports that she does not drink alcohol or use illicit drugs.   Prenatal Transfer Tool  Maternal Diabetes: No Genetic Screening: Declined Maternal Ultrasounds/Referrals: Normal Fetal Ultrasounds or other Referrals:  None Maternal Substance Abuse:  No Significant Maternal Medications:  None Significant Maternal Lab Results:  Lab values include: Group B Strep positive Other Comments:  None  Review of Systems  Respiratory: Negative.   Cardiovascular: Negative.     Dilation: 10 Effacement (%): 100 Station: +1 Exam by:: ansah-mensah, rnc  Blood pressure 117/80, pulse 75, temperature 97.7 F (36.5 C), temperature source Oral, resp. rate 18, height 5\' 9"   (1.753 m), weight 115.667 kg (255 lb), last menstrual period 04/05/2015, SpO2 99 %. Maternal Exam:  Uterine Assessment: Contraction strength is moderate.  Contraction frequency is regular.   Abdomen: Patient reports no abdominal tenderness. Estimated fetal weight is 7 lbs.   Fetal presentation: vertex  Introitus: Normal vulva. Normal vagina.  Amniotic fluid character: clear.  Pelvis: adequate for delivery.   Cervix: Cervix evaluated by digital exam.     Physical Exam  Vitals reviewed. Constitutional: She appears well-developed and well-nourished.  Cardiovascular: Normal rate and normal heart sounds.   No murmur heard. Respiratory: Effort normal and breath sounds normal. No respiratory distress. She has no wheezes.  GI: Soft.    Prenatal labs: ABO, Rh: --/--/O POS, O POS (06/03 1045) Antibody: NEG (06/03 1045) Rubella: Immune (11/10 0000) RPR: Nonreactive (11/10 0000)  HBsAg: Negative (11/10 0000)  HIV: Non-reactive (11/10 0000)  GBS: Positive (05/12 0000)   Assessment/Plan: IUP at 39+ weeks with ROM admitted in early labor, received pitocin augmentation and PCN for +GBS.  See delivery note.   Nyx Keady D 01/06/2016, 9:05 PM

## 2016-01-06 NOTE — MAU Note (Signed)
Pt states she woke up at 0800 and her panties were wet. Pt states she into the bathroom and more water came out. Pt states she has been feeling some contractions since 0200am. Pt denies bleeding. Pt states baby is moving normally.

## 2016-01-06 NOTE — Anesthesia Preprocedure Evaluation (Addendum)
Anesthesia Evaluation  Patient identified by MRN, date of birth, ID band Patient awake    Reviewed: Allergy & Precautions, H&P , NPO status , Patient's Chart, lab work & pertinent test results  History of Anesthesia Complications Negative for: history of anesthetic complications  Airway Mallampati: II  TM Distance: >3 FB Neck ROM: full    Dental no notable dental hx. (+) Teeth Intact, Dental Advisory Given   Pulmonary neg pulmonary ROS,    Pulmonary exam normal breath sounds clear to auscultation       Cardiovascular Exercise Tolerance: Good negative cardio ROS Normal cardiovascular exam Rhythm:regular Rate:Normal     Neuro/Psych negative neurological ROS  negative psych ROS   GI/Hepatic negative GI ROS, Neg liver ROS,   Endo/Other  negative endocrine ROS  Renal/GU negative Renal ROS  negative genitourinary   Musculoskeletal   Abdominal   Peds  Hematology negative hematology ROS (+)   Anesthesia Other Findings   Reproductive/Obstetrics negative OB ROS                            Anesthesia Physical Anesthesia Plan  ASA: I  Anesthesia Plan: Epidural   Post-op Pain Management:    Induction:   Airway Management Planned:   Additional Equipment:   Intra-op Plan:   Post-operative Plan:   Informed Consent: I have reviewed the patients History and Physical, chart, labs and discussed the procedure including the risks, benefits and alternatives for the proposed anesthesia with the patient or authorized representative who has indicated his/her understanding and acceptance.   Dental Advisory Given  Plan Discussed with: CRNA  Anesthesia Plan Comments:        Anesthesia Quick Evaluation

## 2016-01-06 NOTE — MAU Provider Note (Signed)
Jenna [name] 2324w3d  Comes to MAU with history of leaking of fluid.  Speculum exam done.  Small amount of pooling in speculum.  Fern slide made.  RN to read fern slide.  Clinically appears to have ROM.  Nolene Bernheimerri Burleson, NP

## 2016-01-07 ENCOUNTER — Encounter (HOSPITAL_COMMUNITY)
Admission: AD | Disposition: A | Discharge status: Home / Self Care | Payer: Self-pay | Source: Ambulatory Visit | Attending: Obstetrics and Gynecology

## 2016-01-07 HISTORY — PX: TUBAL LIGATION: SHX77

## 2016-01-07 LAB — SURGICAL PCR SCREEN
MRSA, PCR: NEGATIVE
STAPHYLOCOCCUS AUREUS: NEGATIVE

## 2016-01-07 LAB — RPR: RPR: NONREACTIVE

## 2016-01-07 SURGERY — LIGATION, FALLOPIAN TUBE, POSTPARTUM
Anesthesia: Epidural | Site: Abdomen | Laterality: Bilateral

## 2016-01-07 MED ORDER — MIDAZOLAM HCL 2 MG/2ML IJ SOLN
INTRAMUSCULAR | Status: AC
  Filled 2016-01-07: qty 2

## 2016-01-07 MED ORDER — FENTANYL CITRATE (PF) 100 MCG/2ML IJ SOLN
25.0000 ug | INTRAMUSCULAR | Status: DC | PRN
  Administered 2016-01-07 (×2): 50 ug via INTRAVENOUS
  Administered 2016-01-07 (×2): 25 ug via INTRAVENOUS

## 2016-01-07 MED ORDER — LACTATED RINGERS IV SOLN
INTRAVENOUS | Status: DC
  Administered 2016-01-07 (×2): via INTRAVENOUS

## 2016-01-07 MED ORDER — FENTANYL CITRATE (PF) 100 MCG/2ML IJ SOLN
INTRAMUSCULAR | Status: AC
  Filled 2016-01-07: qty 2

## 2016-01-07 MED ORDER — ONDANSETRON HCL 4 MG/2ML IJ SOLN
INTRAMUSCULAR | Status: AC
  Filled 2016-01-07: qty 2

## 2016-01-07 MED ORDER — MIDAZOLAM HCL 2 MG/2ML IJ SOLN
INTRAMUSCULAR | Status: DC | PRN
  Administered 2016-01-07: 2 mg via INTRAVENOUS

## 2016-01-07 MED ORDER — FENTANYL CITRATE (PF) 100 MCG/2ML IJ SOLN
INTRAMUSCULAR | Status: AC
  Administered 2016-01-07: 50 ug via INTRAVENOUS
  Filled 2016-01-07: qty 2

## 2016-01-07 MED ORDER — ONDANSETRON HCL 4 MG/2ML IJ SOLN
INTRAMUSCULAR | Status: DC | PRN
  Administered 2016-01-07: 4 mg via INTRAVENOUS

## 2016-01-07 MED ORDER — FENTANYL CITRATE (PF) 100 MCG/2ML IJ SOLN
INTRAMUSCULAR | Status: AC
Start: 2016-01-07 — End: 2016-01-08
  Filled 2016-01-07: qty 2

## 2016-01-07 MED ORDER — FAMOTIDINE 20 MG PO TABS
40.0000 mg | ORAL_TABLET | Freq: Once | ORAL | Status: AC
  Administered 2016-01-07: 40 mg via ORAL
  Filled 2016-01-07: qty 2

## 2016-01-07 MED ORDER — LACTATED RINGERS IV SOLN
INTRAVENOUS | Status: DC
  Administered 2016-01-07: 14:00:00 via INTRAVENOUS

## 2016-01-07 MED ORDER — METOCLOPRAMIDE HCL 10 MG PO TABS
10.0000 mg | ORAL_TABLET | Freq: Once | ORAL | Status: AC
  Administered 2016-01-07: 10 mg via ORAL
  Filled 2016-01-07: qty 1

## 2016-01-07 MED ORDER — LACTATED RINGERS IV SOLN
INTRAVENOUS | Status: DC | PRN
  Administered 2016-01-07: 11:00:00 via INTRAVENOUS

## 2016-01-07 MED ORDER — BUPIVACAINE HCL (PF) 0.25 % IJ SOLN
INTRAMUSCULAR | Status: DC | PRN
  Administered 2016-01-07: 8 mL
  Administered 2016-01-07: 10 mL

## 2016-01-07 MED ORDER — SODIUM BICARBONATE 8.4 % IV SOLN
INTRAVENOUS | Status: DC | PRN
  Administered 2016-01-07: 7 mL via EPIDURAL
  Administered 2016-01-07: 5 mL via EPIDURAL
  Administered 2016-01-07 (×2): 3 mL via EPIDURAL
  Administered 2016-01-07: 5 mL via EPIDURAL

## 2016-01-07 MED ORDER — LIDOCAINE-EPINEPHRINE (PF) 2 %-1:200000 IJ SOLN
INTRAMUSCULAR | Status: AC
  Filled 2016-01-07: qty 40

## 2016-01-07 MED ORDER — SODIUM BICARBONATE 8.4 % IV SOLN
INTRAVENOUS | Status: AC
  Filled 2016-01-07: qty 100

## 2016-01-07 MED ORDER — FENTANYL CITRATE (PF) 100 MCG/2ML IJ SOLN
INTRAMUSCULAR | Status: DC | PRN
  Administered 2016-01-07 (×2): 50 ug via INTRAVENOUS

## 2016-01-07 SURGICAL SUPPLY — 26 items
BLADE SURG 11 STRL SS (BLADE) ×3 IMPLANT
CLOTH BEACON ORANGE TIMEOUT ST (SAFETY) ×3 IMPLANT
CONTAINER PREFILL 10% NBF 15ML (MISCELLANEOUS) ×6 IMPLANT
DRSG OPSITE POSTOP 3X4 (GAUZE/BANDAGES/DRESSINGS) ×3 IMPLANT
DURAPREP 26ML APPLICATOR (WOUND CARE) ×3 IMPLANT
GLOVE BIO SURGEON STRL SZ8 (GLOVE) ×3 IMPLANT
GLOVE BIOGEL PI IND STRL 7.0 (GLOVE) ×1 IMPLANT
GLOVE BIOGEL PI INDICATOR 7.0 (GLOVE) ×2
GLOVE ORTHO TXT STRL SZ7.5 (GLOVE) ×3 IMPLANT
GOWN STRL REUS W/TWL LRG LVL3 (GOWN DISPOSABLE) ×6 IMPLANT
LIQUID BAND (GAUZE/BANDAGES/DRESSINGS) ×3 IMPLANT
NEEDLE HYPO 22GX1.5 SAFETY (NEEDLE) ×3 IMPLANT
NS IRRIG 1000ML POUR BTL (IV SOLUTION) ×3 IMPLANT
PACK ABDOMINAL MINOR (CUSTOM PROCEDURE TRAY) ×3 IMPLANT
SLEEVE SCD COMPRESS KNEE MED (MISCELLANEOUS) ×3 IMPLANT
SPONGE LAP 4X18 X RAY DECT (DISPOSABLE) ×3 IMPLANT
SUT PLAIN 0 NONE (SUTURE) ×3 IMPLANT
SUT VIC AB 0 CT1 27 (SUTURE) ×2
SUT VIC AB 0 CT1 27XBRD ANBCTR (SUTURE) ×1 IMPLANT
SUT VIC AB 3-0 SH 27 (SUTURE) ×4
SUT VIC AB 3-0 SH 27X BRD (SUTURE) ×2 IMPLANT
SUT VICRYL RAPIDE 4/0 PS 2 (SUTURE) ×3 IMPLANT
SYR CONTROL 10ML LL (SYRINGE) ×3 IMPLANT
TOWEL OR 17X24 6PK STRL BLUE (TOWEL DISPOSABLE) ×6 IMPLANT
TRAY FOLEY CATH SILVER 14FR (SET/KITS/TRAYS/PACK) ×3 IMPLANT
WATER STERILE IRR 1000ML POUR (IV SOLUTION) IMPLANT

## 2016-01-07 NOTE — Op Note (Signed)
Preoperative diagnosis: Postpartum, desires surgical sterility Postoperative diagnosis: Same Procedure: Postpartum bilateral salpingectomy Surgeon: Lavina Hammanodd Starnisha Batrez M.D. Anesthesia: Epidural Findings: She had normal postpartum anatomy Estimated blood loss: Minimal Specimens: Portions of bilateral fallopian tubes Complications: None  Procedure in detail:  The patient was taken to the operating room placed in the dorsosupine position. Her previously placed epidural was dosed appropriately. Abdomen was then prepped and draped in the usual sterile fashion. The level of her anesthesia was found to be adequate. Infraumbilical skin was infiltrated with quarter percent Marcaine and a 3 cm horizontal incision was made. The fascia was identified and elevated. Fascia was entered sharply and this also entered the peritoneal cavity. [employer] retractors were then used to expose both fallopian tubes. Each fallopian tube was identified and traced to its fimbriated end. The distal portion of each fallopian tube was elevated with a Babcock clamp. The distal 2/3 of each tube was clamped with a Kelly and removed sharply.  The pedicles were then tied with 0 plain gut ties.  Bleeding on the left was controlled with 3-0 Vicryl. Fascia was then closed in running fashion with 0 Vicryl. Skin was closed with running subcuticular suture of 4 4-0 Vicryl followed by  Liqui-band and a sterile dressing. Patient tolerated the procedure well and was taken to the recovery in stable condition. Counts were correct and she had PAS hose on throughout the procedure.

## 2016-01-07 NOTE — Lactation Note (Signed)
This note was copied from a baby's chart. Lactation Consultation Note: Follow up visit with mom. She reports she is struggling with breastfeeding- is having trouble getting him deep onto the breast- he keeps sliding to tip of nipple and it hurts. Baby skin to skin with dad after bath. Getting ready to attempt latch but OB in and ready to do tubal. Encouraged mom to call for assist when she is ready to try to feed again  Patient Name: Jenna [name]: 01/07/2016 Reason for consult: Follow-up assessment   Maternal Data Formula Feeding for Exclusion: No  Feeding    LATCH Score/Interventions                      Lactation Tools Discussed/Used     Consult Status Consult Status: Follow-up [name]: 01/07/16 Follow-up type: In-patient    Pamelia HoitWeeks, Steffen Hase D 01/07/2016, 10:57 AM

## 2016-01-07 NOTE — Lactation Note (Signed)
This note was copied from a baby's chart. Lactation Consultation Note  Patient Name: Jenna [name] ZOXWR'UToday's Date: 01/07/2016 Reason for consult: Follow-up assessment Baby at 19 hr of life. Mom has not attempted to bf since 1100 this afternoon. She had a BTL and baby was circumcised. Baby was asleep with a friend, after un swaddling baby and changing his diaper he was showing hunger cues. Mom reports after feeding her nipple burn. No skin break down or redness noted. The L nipple has what looks like a small skin tag on the tip. She has nipple cream from home, suggested coconut oil. Mom is private about bf. She requested a bf cover during the consult. Discussed baby behavior, feeding frequency, baby belly size, voids, wt loss, breast changes, and nipple care. She can manually express and has a spoon in room. She is aware of lactation services and support group.      Maternal Data    Feeding Feeding Type: Breast Fed  LATCH Score/Interventions Latch: Repeated attempts needed to sustain latch, nipple held in mouth throughout feeding, stimulation needed to elicit sucking reflex. Intervention(s): Adjust position;Assist with latch;Breast compression  Audible Swallowing: A few with stimulation Intervention(s): Alternate breast massage;Skin to skin;Hand expression  Type of Nipple: Everted at rest and after stimulation  Comfort (Breast/Nipple): Filling, red/small blisters or bruises, mild/mod discomfort  Problem noted: Mild/Moderate discomfort Interventions (Mild/moderate discomfort): Hand expression  Hold (Positioning): Assistance needed to correctly position infant at breast and maintain latch. Intervention(s): Position options;Support Pillows  LATCH Score: 6  Lactation Tools Discussed/Used     Consult Status Consult Status: Follow-up Date: 01/08/16 Follow-up type: In-patient    [name] 01/07/2016, 4:35 PM

## 2016-01-07 NOTE — Transfer of Care (Signed)
Immediate Anesthesia Transfer of Care Note  Patient: Jenna [name]  Procedure(s) Performed: Procedure(s): POST PARTUM TUBAL LIGATION, Salpingectomy (Bilateral)  Patient Location: PACU  Anesthesia Type:Epidural  Level of Consciousness: awake and alert   Airway & Oxygen Therapy: Patient Spontanous Breathing and Patient connected to nasal cannula oxygen  Post-op Assessment: Report given to RN and Post -op Vital signs reviewed and stable  Post vital signs: Reviewed  Last Vitals:  Filed Vitals:   01/07/16 0541 01/07/16 0843  BP: 115/75 118/68  Pulse: 67 72  Temp: 36.4 C 36.5 C  Resp: 18 18    Last Pain:  Filed Vitals:   01/07/16 0843  PainSc: 3       Patients Stated Pain Goal: 4 (01/06/16 1857)  Complications: No apparent anesthesia complications

## 2016-01-07 NOTE — Anesthesia Postprocedure Evaluation (Signed)
Anesthesia Post Note  Patient: Jenna [name]  Procedure(s) Performed: Procedure(s) (LRB): POST PARTUM TUBAL LIGATION, Salpingectomy (Bilateral)  Patient location during evaluation: Mother Baby Anesthesia Type: Epidural Level of consciousness: awake Pain management: pain level controlled Vital Signs Assessment: post-procedure vital signs reviewed and stable Respiratory status: spontaneous breathing Cardiovascular status: stable Postop Assessment: no headache, no backache, epidural receding, patient able to bend at knees, no signs of nausea or vomiting and adequate PO intake Anesthetic complications: no     Last Vitals:  Filed Vitals:   01/07/16 1415 01/07/16 1438  BP: 127/84 134/90  Pulse: 71 76  Temp: 36.7 C 36.7 C  Resp: 15 18    Last Pain:  Filed Vitals:   01/07/16 1456  PainSc: 7    Pain Goal: Patients Stated Pain Goal: 4 (01/06/16 1857)               Fanny DanceMULLINS,Florine Sprenkle

## 2016-01-07 NOTE — Anesthesia Postprocedure Evaluation (Signed)
Anesthesia Post Note  Patient: Jenna [name]  Procedure(s) Performed: Procedure(s) (LRB): POST PARTUM TUBAL LIGATION, Salpingectomy (Bilateral)  Patient location during evaluation: PACU Anesthesia Type: Epidural Level of consciousness: awake and alert Pain management: pain level controlled Vital Signs Assessment: post-procedure vital signs reviewed and stable Respiratory status: spontaneous breathing, nonlabored ventilation, respiratory function stable and patient connected to nasal cannula oxygen Cardiovascular status: blood pressure returned to baseline and stable Postop Assessment: no signs of nausea or vomiting Anesthetic complications: no     Last Vitals:  Filed Vitals:   01/07/16 1230 01/07/16 1245  BP: 104/59 107/63  Pulse: 63 74  Temp:    Resp: 15 16    Last Pain:  Filed Vitals:   01/07/16 1252  PainSc: 1    Pain Goal: Patients Stated Pain Goal: 4 (01/06/16 1857)  LLE Motor Response: Purposeful movement (rocks hips) (01/07/16 1245) LLE Sensation: Decreased;Tingling (01/07/16 1245) RLE Motor Response: Purposeful movement (rocks hips) (01/07/16 1245) RLE Sensation: Tingling;Decreased (01/07/16 1245)      Mishon Blubaugh L

## 2016-01-07 NOTE — Progress Notes (Signed)
PPD #1 Doing well, no problems, ready for BTL Afeb, VSS Fundus firm Continue routine care, for BTL/salpingectomy this morning

## 2016-01-07 NOTE — Addendum Note (Signed)
Addendum  created 01/07/16 1520 by Renford DillsJanet L Liliauna Santoni, CRNA   Modules edited: Clinical Notes   Clinical Notes:  File: [id number]

## 2016-01-08 MED ORDER — PRENATAL MULTIVITAMIN CH: 1.0000 | ORAL_TABLET | Freq: Every day | ORAL | Status: AC

## 2016-01-08 MED ORDER — IBUPROFEN 800 MG PO TABS: 800.0000 mg | ORAL_TABLET | Freq: Three times a day (TID) | ORAL | Status: AC | PRN

## 2016-01-08 MED ORDER — OXYCODONE HCL 5 MG PO TABS: ORAL_TABLET | ORAL | Status: AC

## 2016-01-08 NOTE — Lactation Note (Signed)
This note was copied from a baby's chart. Lactation Consultation Note: Mother reports that breastfeeding is going much better. She states he (L) nipple is still slightly sore from wrong positioning yesterday. Mother showed how to hand express colostrum again and observed a few drops of colostrum. Mother request a hand pump . The harmony hand pump was given to mother with instructions to pre and post pump as needed. Advised mother in treatment to prevent severe engorgement. Reviewed cluster feeding and supply and demand.Highlighted information in Baby and me book on treatment for engorgement. Mother receptive to all teaching.   Patient Name: Jenna [name] Reason for consult: Follow-up assessment   Maternal Data    Feeding    LATCH Score/Interventions                      Lactation Tools Discussed/Used     Consult Status Consult Status: Complete    Michel BickersKendrick, Shiron Whetsel McCoy [name], 10:25 AM

## 2016-01-08 NOTE — Progress Notes (Addendum)
Post Partum Day 2/POD #1 Subjective: no complaints, up ad lib, tolerating PO and nl lochia, pain controlled  Objective: Blood pressure 119/69, pulse 70, temperature 97.9 F (36.6 C), temperature source Oral, resp. rate 18, height 5\' 9"  (1.753 m), weight 115.667 kg (255 lb), last menstrual period 04/05/2015, SpO2 97 %, unknown if currently breastfeeding.  Physical Exam:  General: alert and no distress Lochia: appropriate Uterine Fundus: firm   Recent Labs  01/06/16 1045  HGB 13.1  HCT 37.1    Assessment/Plan: Discharge home, Breastfeeding and Lactation consult.  D/c with motrin and prenatal vitamin.     LOS: 2 days   Bovard-Stuckert, Donold Marotto 01/08/2016, 7:50 AM

## 2016-01-08 NOTE — Discharge Summary (Signed)
Obstetric Discharge Summary Reason for Admission: onset of labor and rupture of membranes Prenatal Procedures: none Intrapartum Procedures: spontaneous vaginal delivery Postpartum Procedures: P.P. tubal ligation Complications-Operative and Postpartum: none HEMOGLOBIN  Date Value Ref Range Status  01/06/2016 13.1 12.0 - 15.0 g/dL Final   HCT  Date Value Ref Range Status  01/06/2016 37.1 36.0 - 46.0 % Final    Physical Exam:  General: alert and no distress Lochia: appropriate Uterine Fundus: firm Incision: healing well DVT Evaluation: No evidence of DVT seen on physical exam.  Discharge Diagnoses: Term Pregnancy-delivered  Discharge Information: Date: 01/08/2016 Activity: pelvic rest Diet: routine Medications: PNV, Ibuprofen and Oxycodone Condition: stable Instructions: refer to practice specific booklet Discharge to: home Follow-up Information    Follow up with MEISINGER,TODD D, MD. Schedule an appointment as soon as possible for a visit in 4 weeks.   Specialty:  Obstetrics and Gynecology   Why:  Postpartum/post op check   Contact information:   [address], [address] [place] [id number] [phone]       Newborn Data: Live born female  Birth Weight: 7 lb 3.3 oz (3270 g) APGAR: 9, 9  Home with mother.  Bovard-Stuckert, Jacky Hartung 01/08/2016, 8:17 AM

## 2016-01-09 ENCOUNTER — Encounter (HOSPITAL_COMMUNITY): Payer: Self-pay | Admitting: Obstetrics and Gynecology

## 2016-01-10 NOTE — Addendum Note (Signed)
Addendum  created 01/10/16 1316 by Ronelle Nighharles Yvonda Fouty, MD   Modules edited: Anesthesia Events, Narrator   Narrator:  Narrator: Event Log Edited

## 2016-01-10 NOTE — Addendum Note (Signed)
Addendum  created 01/10/16 0841 by Fabienne BrunsWendy M Kazue Cerro, RN   Modules edited: Anesthesia Responsible Staff

## 2016-01-12 ENCOUNTER — Inpatient Hospital Stay (HOSPITAL_COMMUNITY): Admission: RE | Admit: 2016-01-12 | Payer: BLUE CROSS/BLUE SHIELD | Source: Ambulatory Visit

## 2016-02-14 ENCOUNTER — Other Ambulatory Visit: Payer: Self-pay | Admitting: Obstetrics and Gynecology

## 2016-02-14 DIAGNOSIS — G8918 Other acute postprocedural pain: Secondary | ICD-10-CM

## 2016-02-21 ENCOUNTER — Ambulatory Visit
Admission: RE | Admit: 2016-02-21 | Discharge: 2016-02-21 | Disposition: A | Discharge status: Home / Self Care | Payer: BLUE CROSS/BLUE SHIELD | Source: Ambulatory Visit | Attending: Obstetrics and Gynecology | Admitting: Obstetrics and Gynecology

## 2016-02-21 DIAGNOSIS — G8918 Other acute postprocedural pain: Secondary | ICD-10-CM

## 2016-11-02 IMAGING — US US ABDOMEN COMPLETE
1 series · 13 of 25 positions shown · non-contrast
Comparison: No recent studies in PACs

CLINICAL DATA: Post- operative periumbilical pain status post SVD
and BTL ; pain is around the umbilical incision ; evaluate for
hernia.

EXAM:
ABDOMEN ULTRASOUND COMPLETE

[Series 1: us abdomen complete · 0.11mm/px · 13 of 91 slices shown]
[im 1/91]
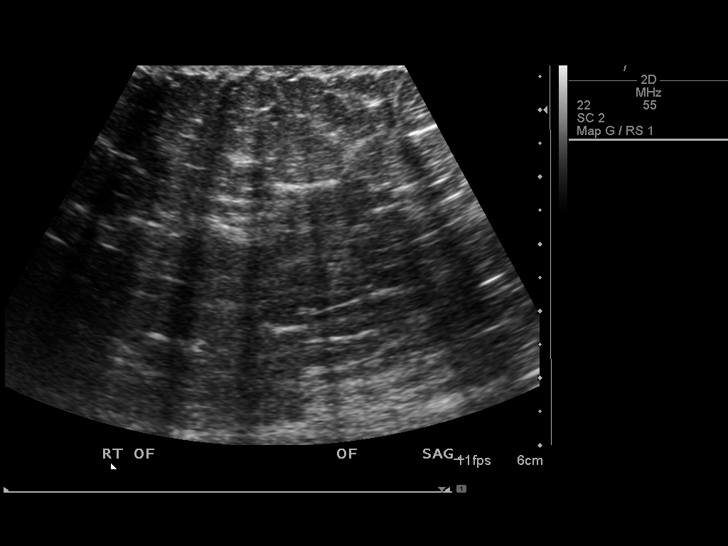
[im 8/91]
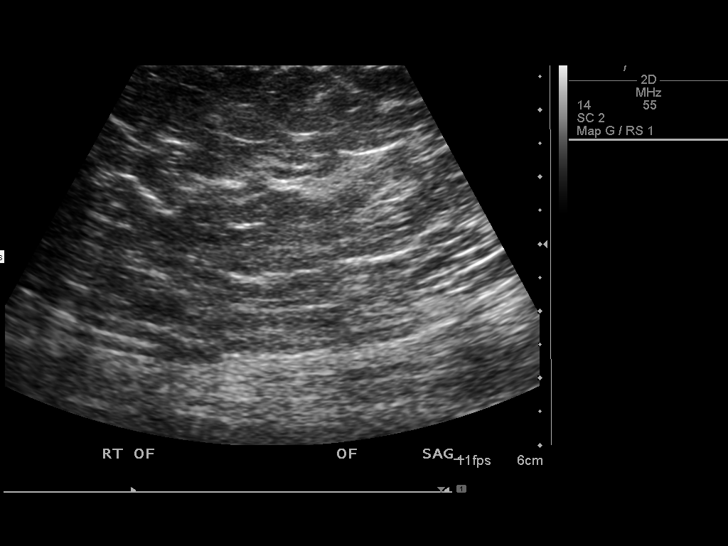
[im 16/91]
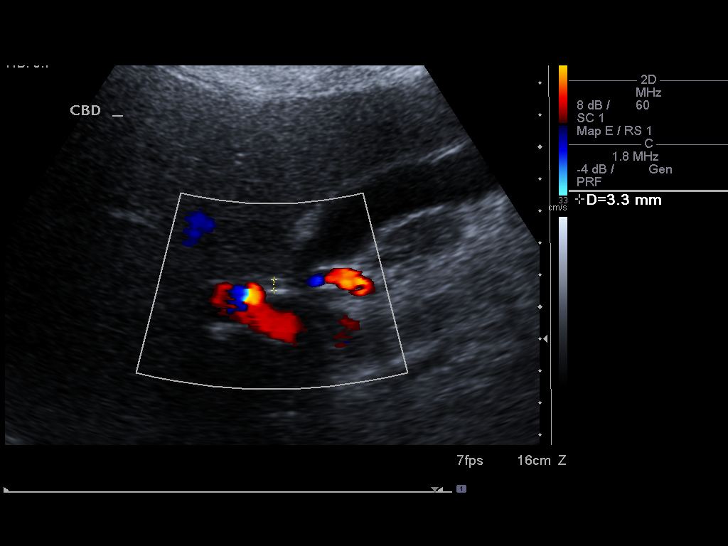
[im 23/91]
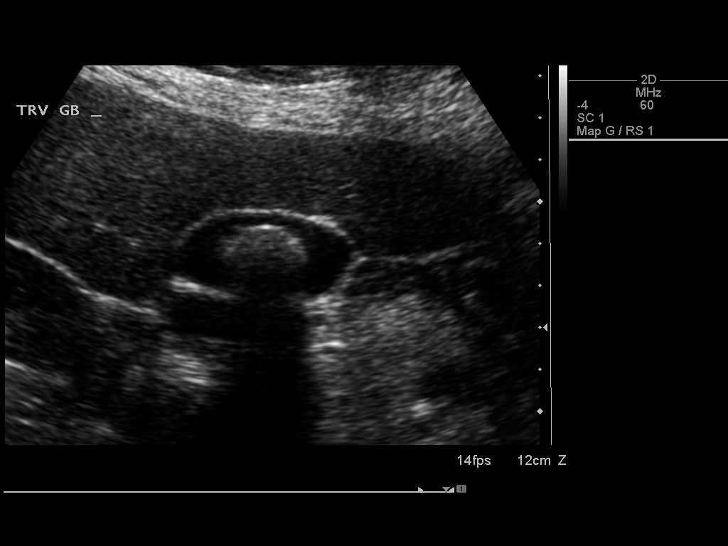
[im 31/91]
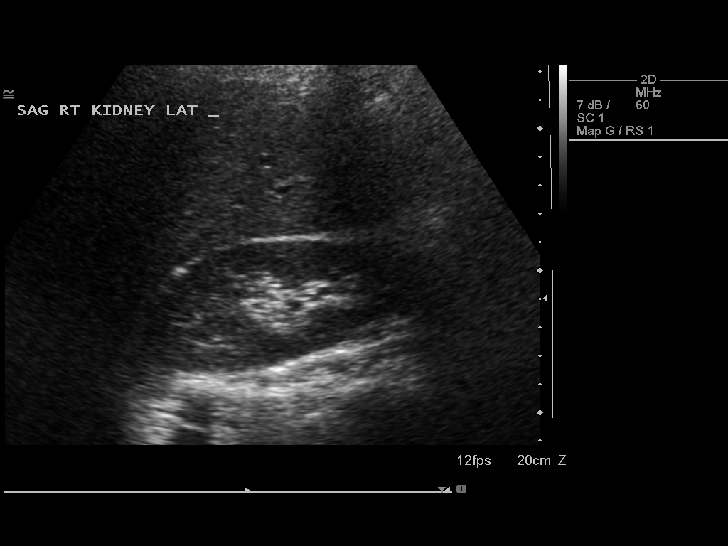
[im 38/91]
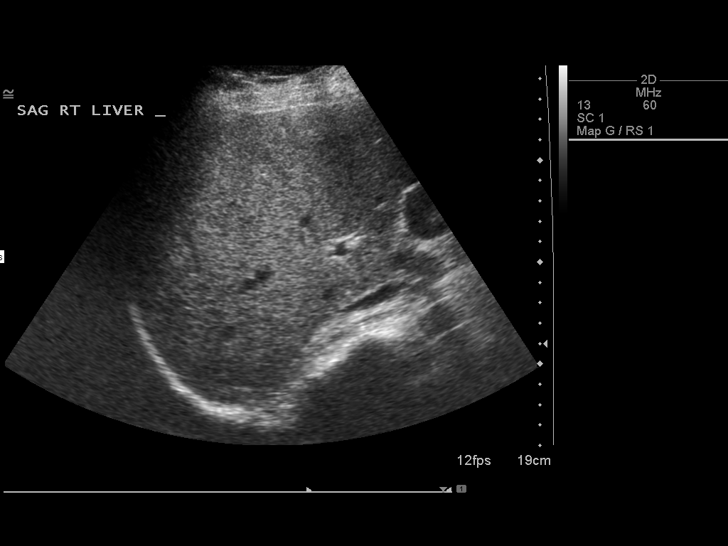
[im 46/91]
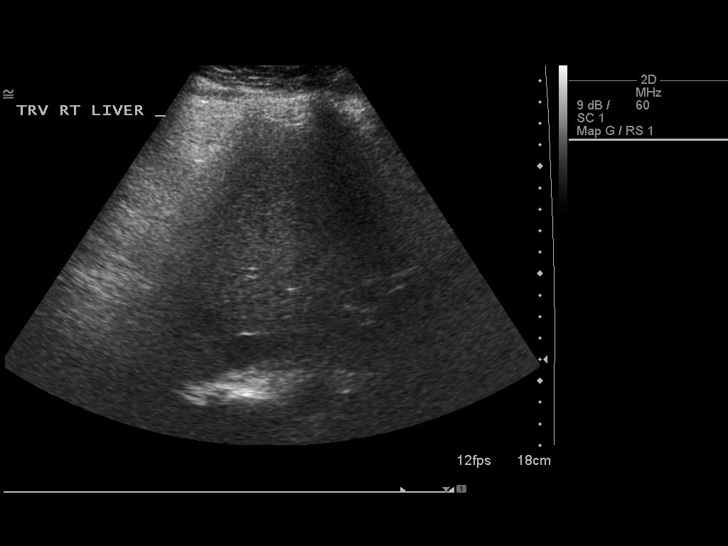
[im 53/91]
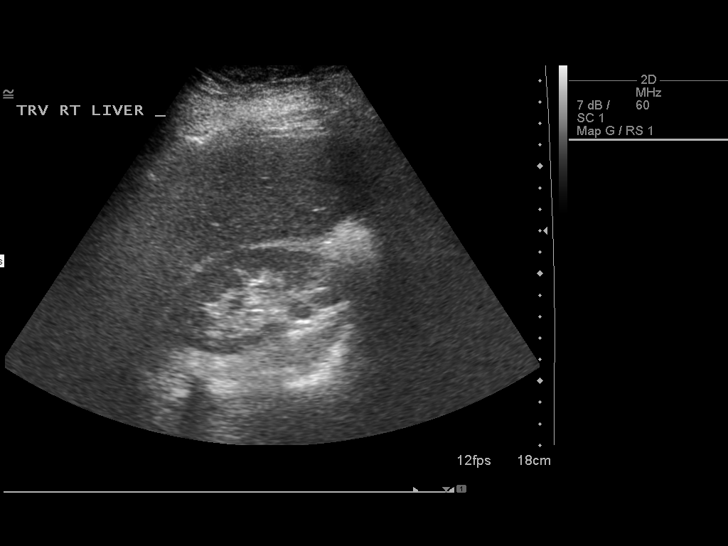
[im 61/91]
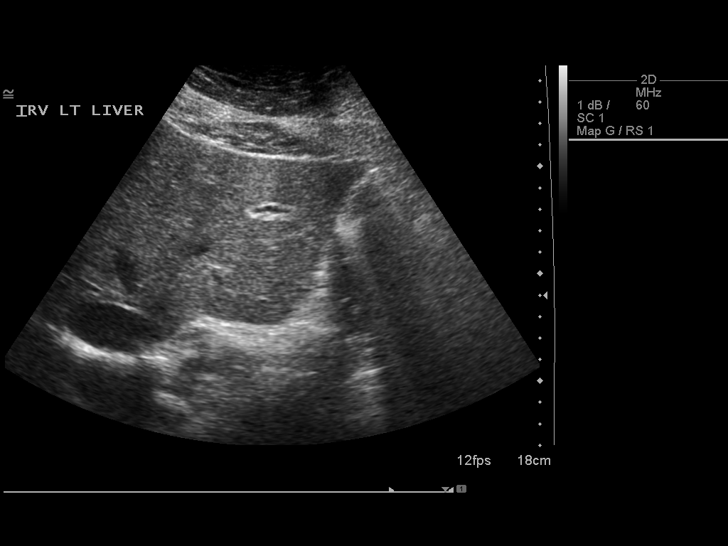
[im 68/91]
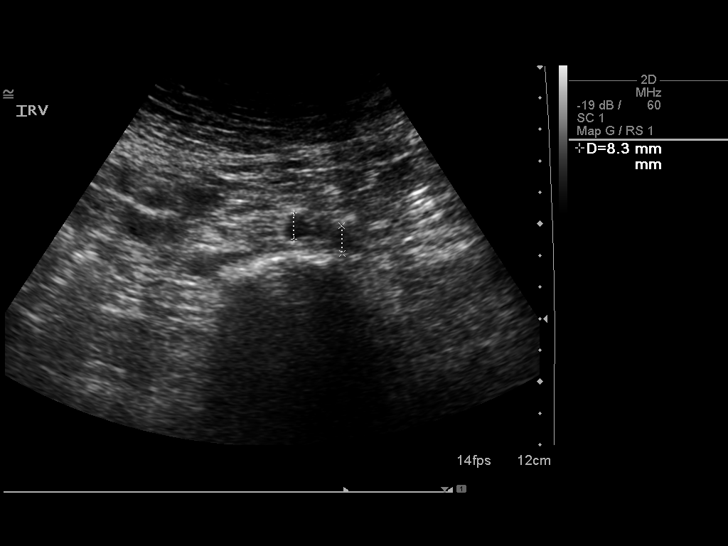
[im 76/91]
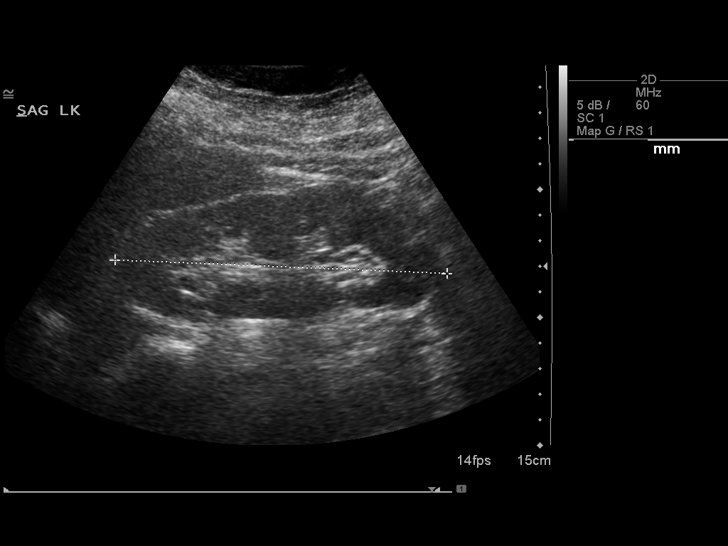
[im 83/91]
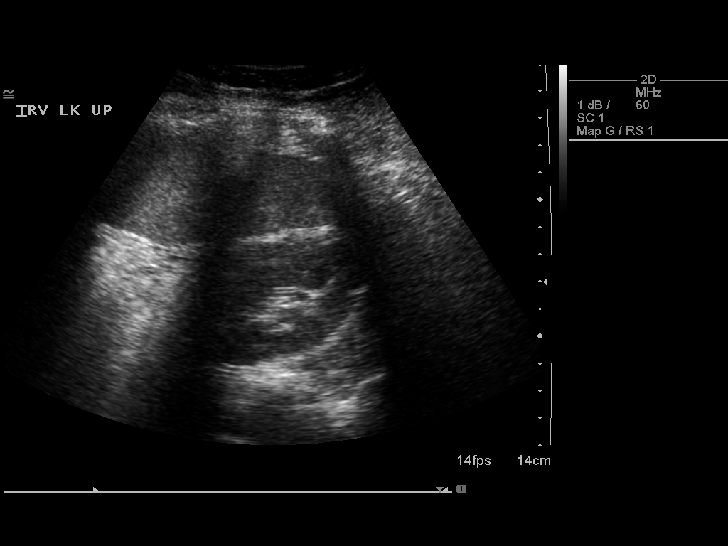
[im 91/91]
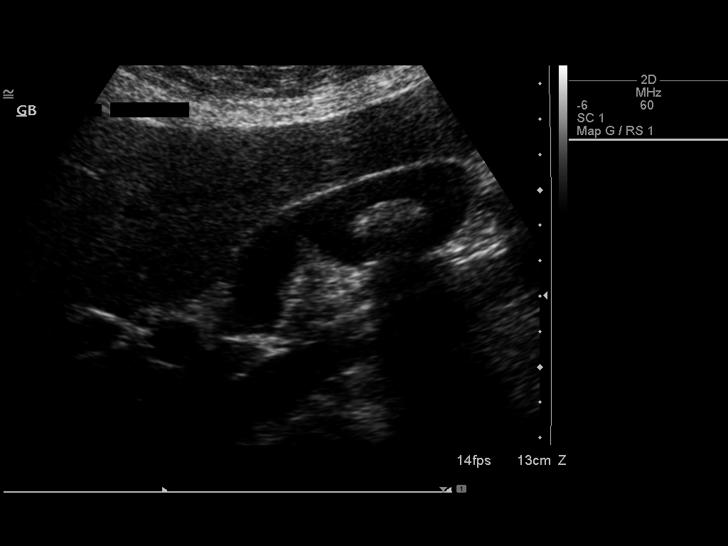

[13 of 25 positions shown; findings below may reference images not displayed]

FINDINGS: Gallbladder: The gallbladder is adequately distended. There is a
cm diameter echogenic mobile shadowing stone. There is no
gallbladder wall thickening, pericholecystic fluid, or positive
sonographic Murphy's sign.

Common bile duct: Diameter: 3.3 mm

Liver: The hepatic echotexture is somewhat heterogeneous. There is
no focal mass or ductal dilation.

IVC: Bowel gas limits evaluation of the IVC.

Pancreas: The pancreas was obscured by bowel gas.

Spleen: Size and appearance within normal limits.

Right Kidney: Length: 12.3 cm. Echogenicity within normal limits. No
mass or hydronephrosis visualized.

Left Kidney: Length: 13.0 cm. Echogenicity within normal limits. No
mass or hydronephrosis visualized.

Abdominal aorta: No aneurysm visualized.

Other findings: Interrogation of the periumbilical region reveals no
evidence of fat or bowel containing hernia. No abnormal assistant
per solid-appearing masses are observed.
IMPRESSION: 1. No periumbilical hernia is observed.
2. Gallstone without sonographic evidence of acute cholecystitis.
The liver and common bile duct are unremarkable.
3. The inferior vena cava and pancreas were largely obscured by
bowel gas.
hernia is observed.
